# Patient Record
Sex: Female | Born: 1960
Health system: Southern US, Community
[De-identification: ages and names within clinical notes are randomized; demographics above are authoritative.]

## PROBLEM LIST (undated history)

## (undated) DIAGNOSIS — I10 Essential (primary) hypertension: Secondary | ICD-10-CM

## (undated) HISTORY — PX: CARPAL TUNNEL RELEASE: SHX101

## (undated) HISTORY — PX: TONSILLECTOMY: SUR1361

## (undated) HISTORY — PX: ABDOMINAL HYSTERECTOMY: SHX81

---

## 2013-07-26 ENCOUNTER — Emergency Department (HOSPITAL_BASED_OUTPATIENT_CLINIC_OR_DEPARTMENT_OTHER)
Admission: EM | Admit: 2013-07-26 | Discharge: 2013-07-26 | Disposition: A | Discharge status: Home / Self Care | Payer: Worker's Compensation | Attending: Emergency Medicine | Admitting: Emergency Medicine

## 2013-07-26 ENCOUNTER — Encounter (HOSPITAL_BASED_OUTPATIENT_CLINIC_OR_DEPARTMENT_OTHER): Payer: Self-pay | Admitting: Emergency Medicine

## 2013-07-26 ENCOUNTER — Emergency Department (HOSPITAL_BASED_OUTPATIENT_CLINIC_OR_DEPARTMENT_OTHER): Payer: Worker's Compensation

## 2013-07-26 DIAGNOSIS — S20229A Contusion of unspecified back wall of thorax, initial encounter: Secondary | ICD-10-CM | POA: Insufficient documentation

## 2013-07-26 DIAGNOSIS — I10 Essential (primary) hypertension: Secondary | ICD-10-CM | POA: Insufficient documentation

## 2013-07-26 DIAGNOSIS — Y92410 Unspecified street and highway as the place of occurrence of the external cause: Secondary | ICD-10-CM | POA: Insufficient documentation

## 2013-07-26 DIAGNOSIS — Y9389 Activity, other specified: Secondary | ICD-10-CM | POA: Insufficient documentation

## 2013-07-26 DIAGNOSIS — Z79899 Other long term (current) drug therapy: Secondary | ICD-10-CM | POA: Insufficient documentation

## 2013-07-26 DIAGNOSIS — T148XXA Other injury of unspecified body region, initial encounter: Secondary | ICD-10-CM | POA: Diagnosis present

## 2013-07-26 HISTORY — DX: Essential (primary) hypertension: I10
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
## 2015-07-02 IMAGING — CR DG LUMBAR SPINE 2-3V
3 series · 3 of 3 positions shown · non-contrast
Comparison: None.

CLINICAL DATA: Fall.

EXAM:
LUMBAR SPINE - 2-3 VIEW

[t l-spine a.p.]
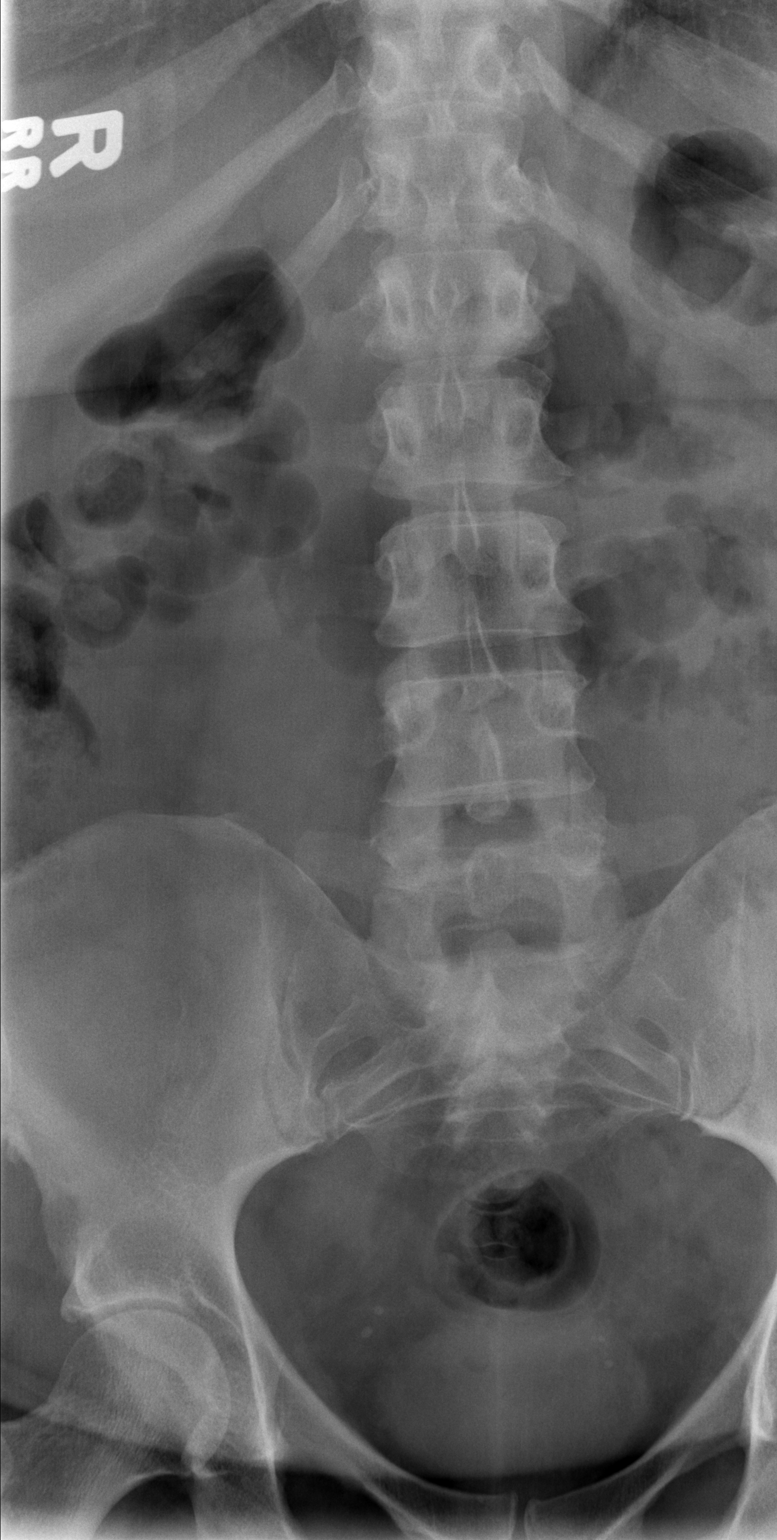

[t l-spine lat]
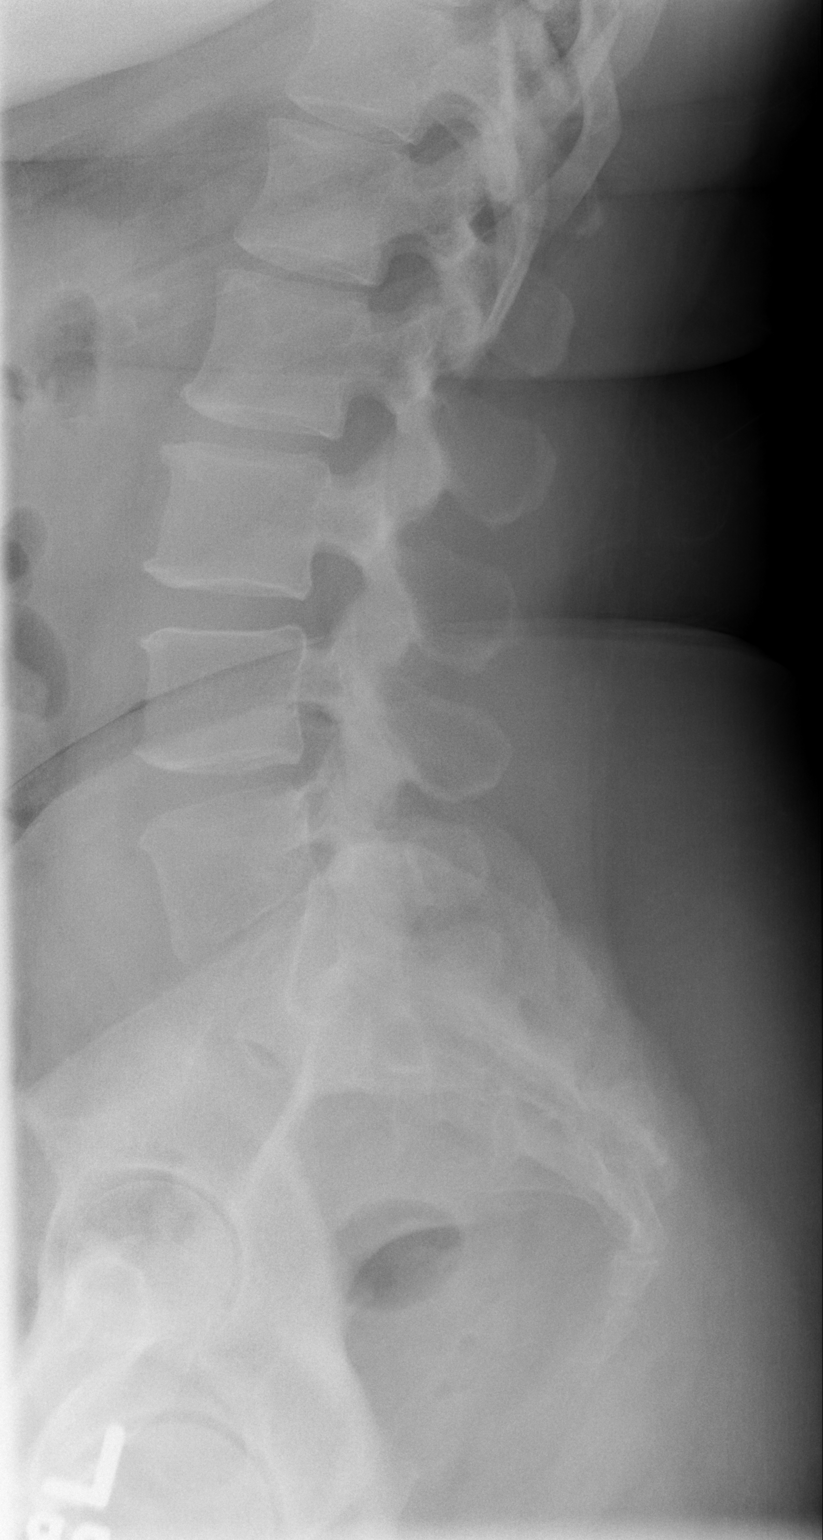

[t l-spine l5-s1 spot]
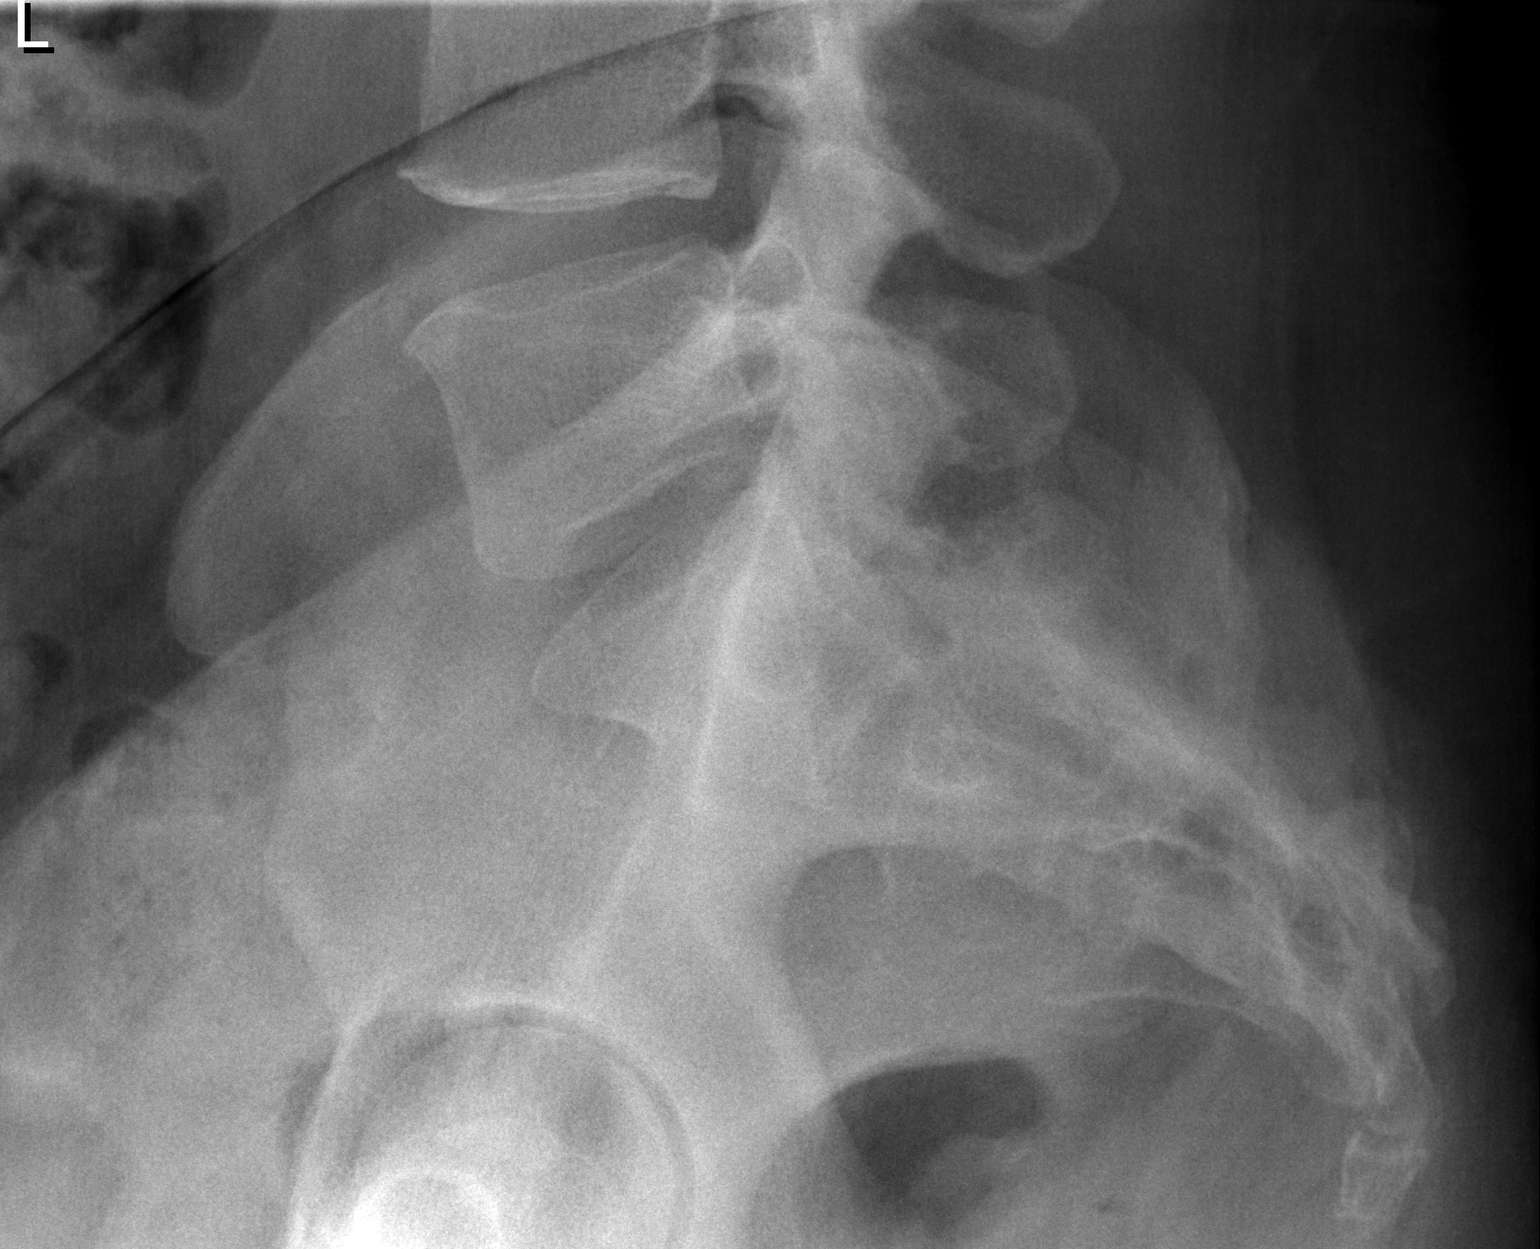

[3 of 3 positions shown; findings below may reference images not displayed]

FINDINGS: There is no evidence of lumbar spine fracture. Alignment is normal.
Intervertebral disc spaces are maintained.
IMPRESSION: Negative.

## 2015-07-02 IMAGING — CR DG THORACIC SPINE 2V
3 series · 3 of 3 positions shown · non-contrast
Comparison: None.

CLINICAL DATA: Fall, mid back pain and buttock pain.

EXAM:
THORACIC SPINE - 2 VIEW

[w t-spine a.p. *]
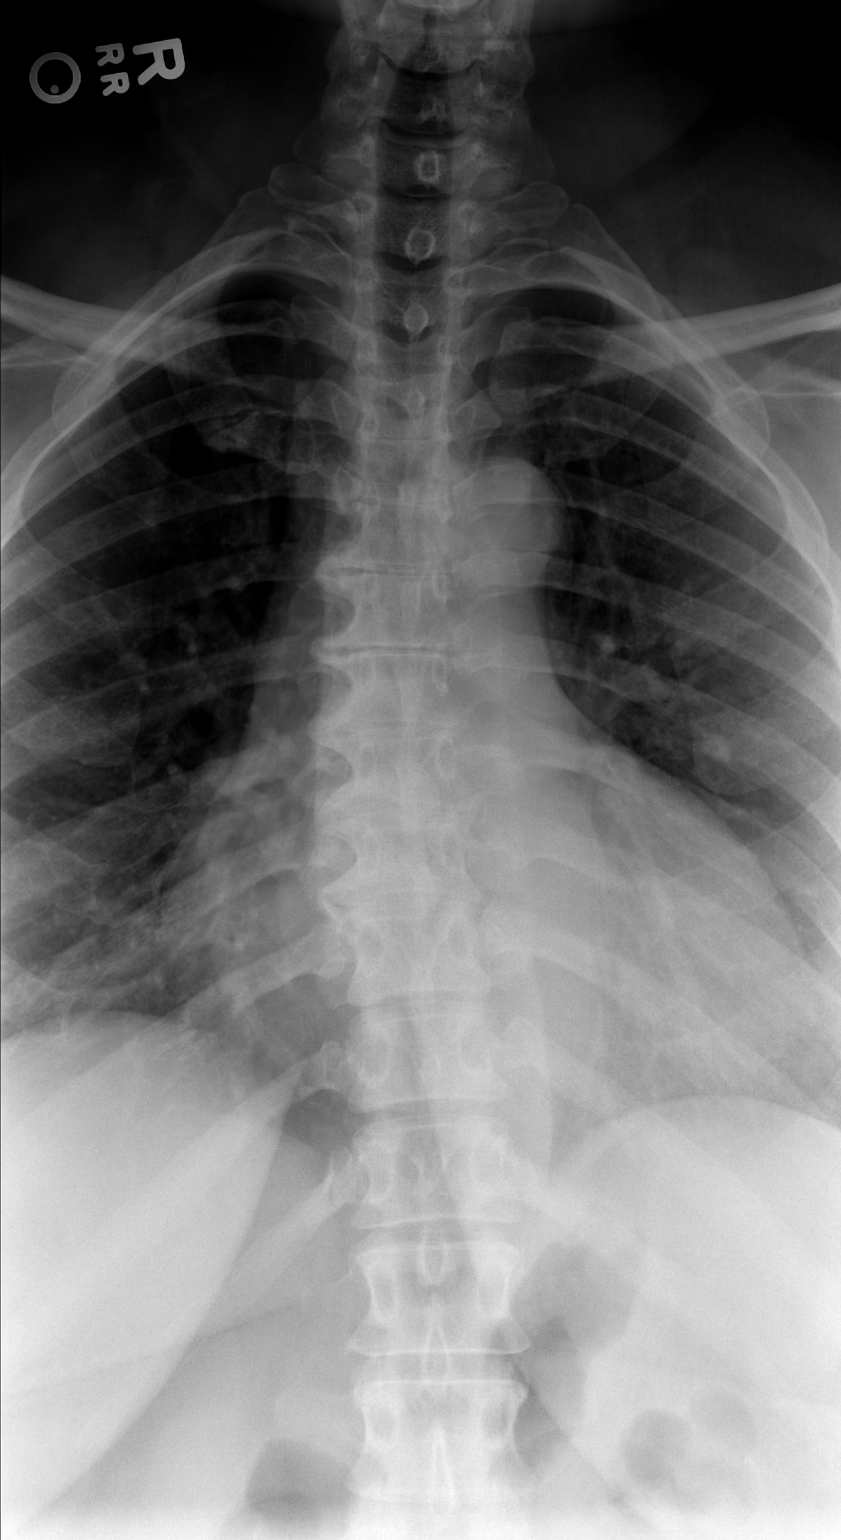

[w t-spine lat *]
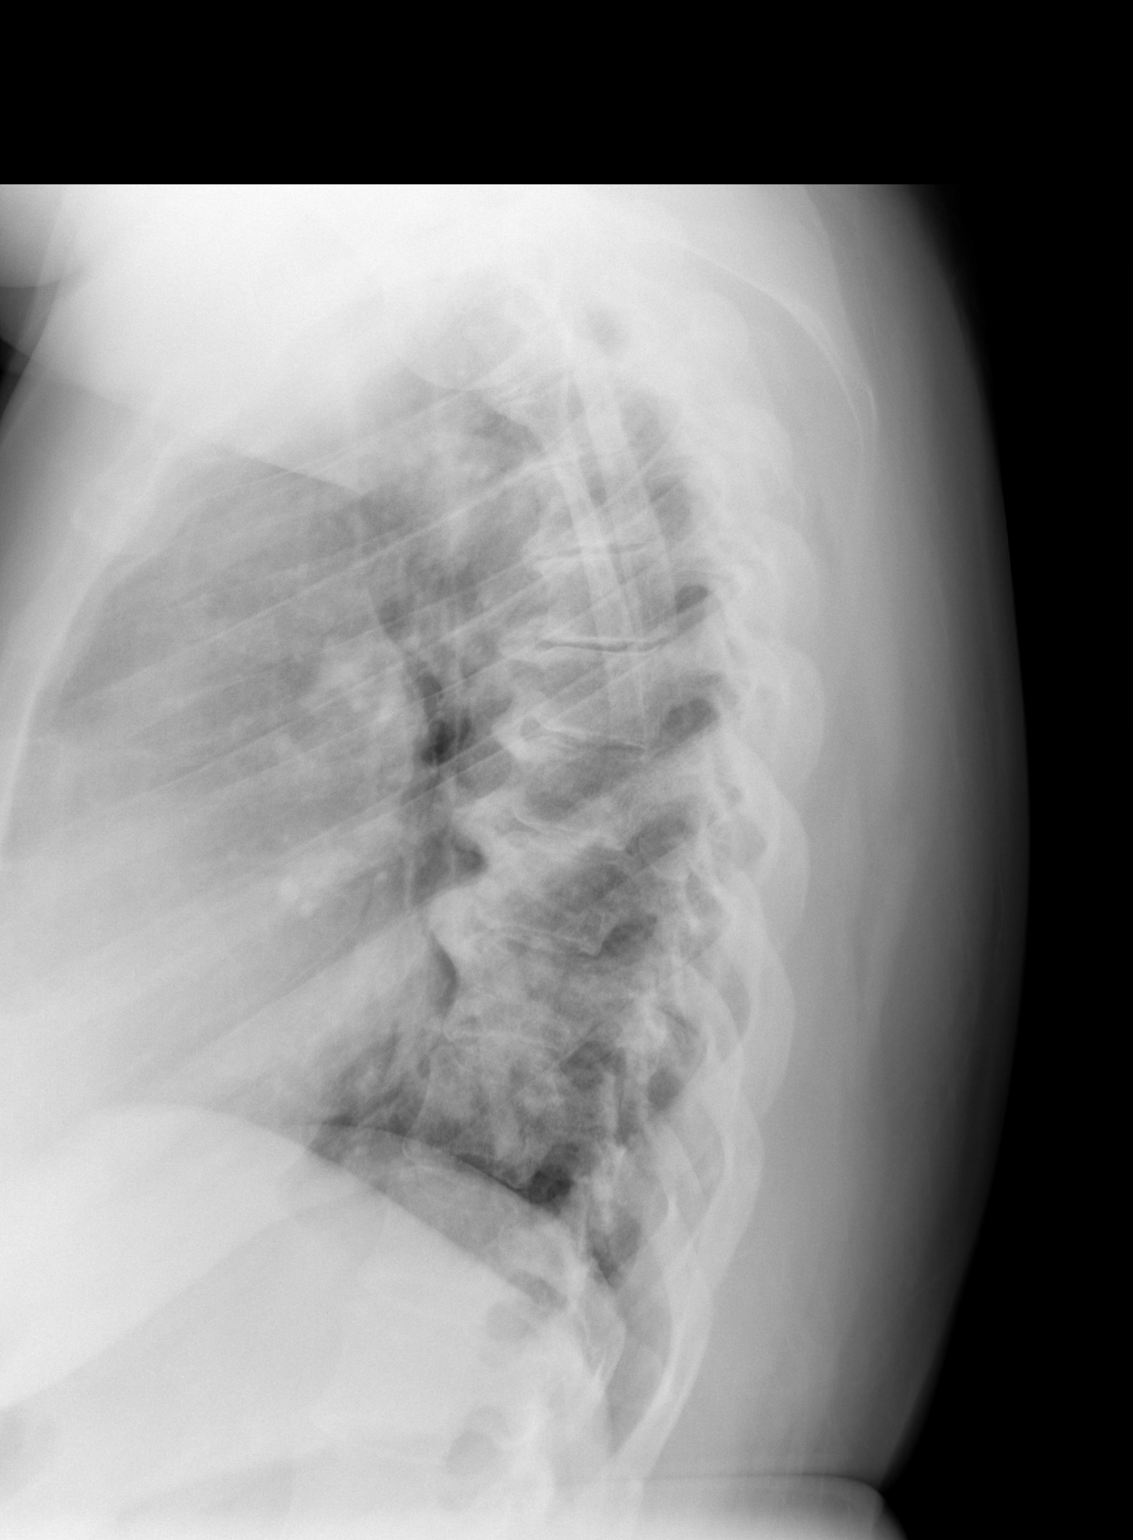

[w swimmers view *]
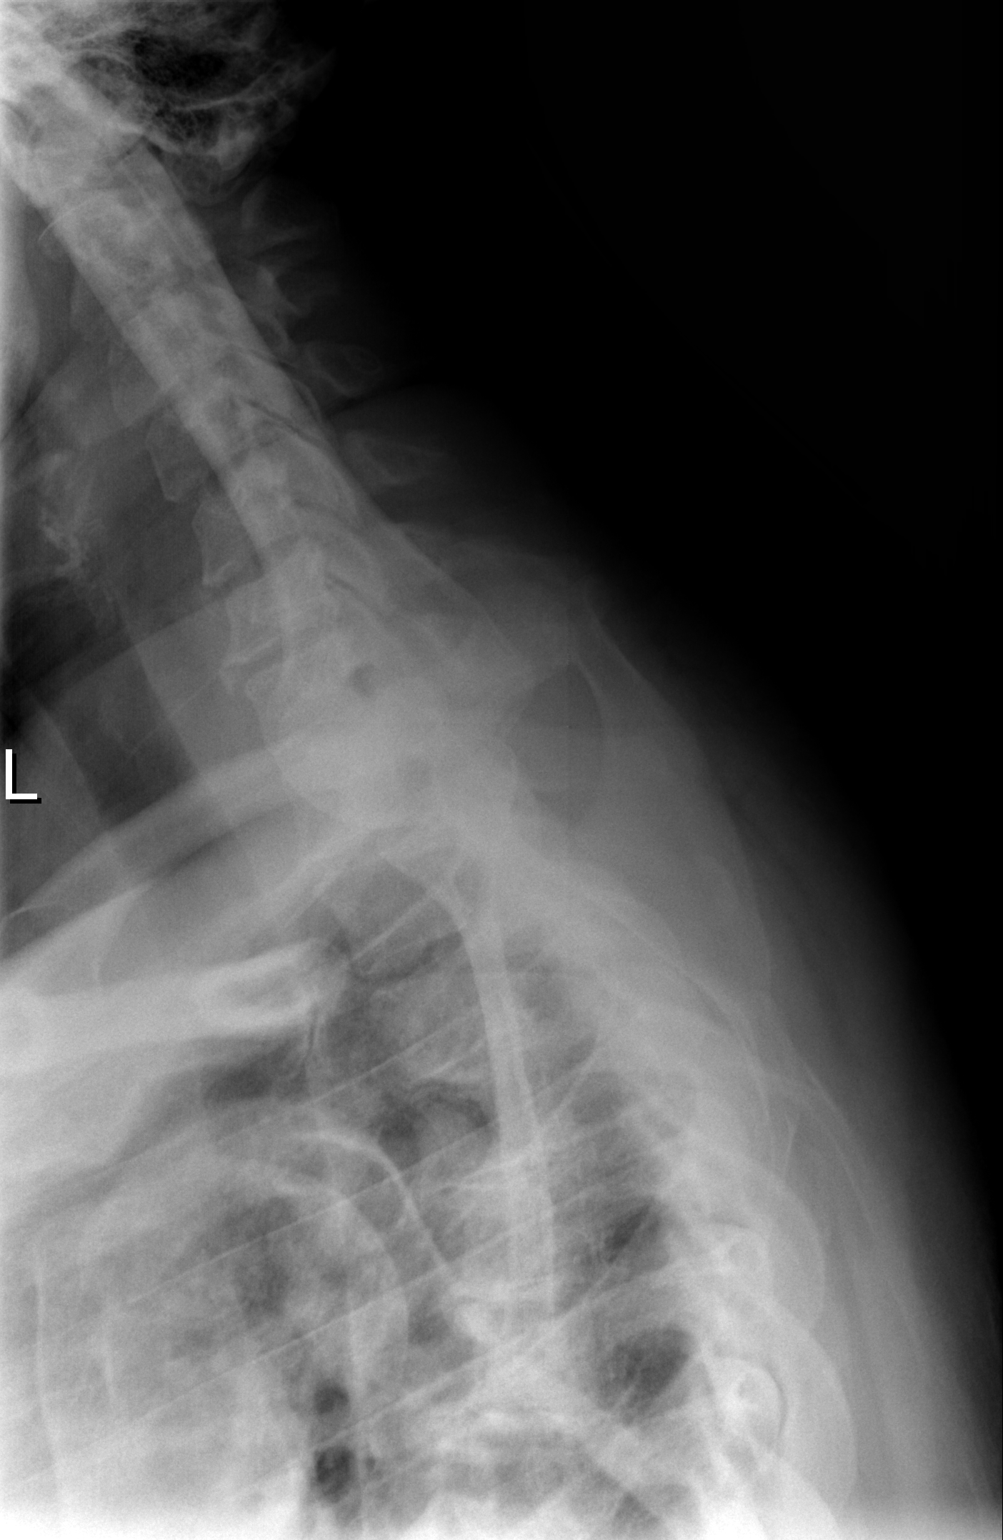

[3 of 3 positions shown; findings below may reference images not displayed]

FINDINGS: The vertebral body heights are maintained. The alignment is
anatomic. There is no acute fracture or static listhesis. There is
mild degenerative disc disease throughout the thoracic spine.

The visualized portions of the lungs are clear.
IMPRESSION: No acute osseous injury of the thoracic spine.

## 2017-09-16 ENCOUNTER — Emergency Department (HOSPITAL_BASED_OUTPATIENT_CLINIC_OR_DEPARTMENT_OTHER)
Admission: EM | Admit: 2017-09-16 | Discharge: 2017-09-16 | Disposition: A | Discharge status: Home / Self Care | Payer: Worker's Compensation | Attending: Emergency Medicine | Admitting: Emergency Medicine

## 2017-09-16 ENCOUNTER — Other Ambulatory Visit: Payer: Self-pay

## 2017-09-16 ENCOUNTER — Encounter (HOSPITAL_BASED_OUTPATIENT_CLINIC_OR_DEPARTMENT_OTHER): Payer: Self-pay | Admitting: Emergency Medicine

## 2017-09-16 DIAGNOSIS — R499 Unspecified voice and resonance disorder: Secondary | ICD-10-CM | POA: Diagnosis present

## 2017-09-16 DIAGNOSIS — H938X3 Other specified disorders of ear, bilateral: Secondary | ICD-10-CM

## 2017-09-16 DIAGNOSIS — I10 Essential (primary) hypertension: Secondary | ICD-10-CM | POA: Diagnosis not present

## 2017-09-16 DIAGNOSIS — H9203 Otalgia, bilateral: Secondary | ICD-10-CM | POA: Insufficient documentation

## 2017-09-16 DIAGNOSIS — Z79899 Other long term (current) drug therapy: Secondary | ICD-10-CM | POA: Diagnosis not present

## 2017-09-16 DIAGNOSIS — J392 Other diseases of pharynx: Secondary | ICD-10-CM

## 2017-09-16 MED ORDER — CETIRIZINE HCL 10 MG PO TABS: 10.0000 mg | ORAL_TABLET | Freq: Every day | ORAL | 0 refills | Status: AC

## 2017-09-16 MED ORDER — SALINE SPRAY 0.65 % NA SOLN: 1.0000 | NASAL | 0 refills | Status: AC | PRN

## 2017-09-16 MED FILL — CETIRIZINE HCL 10 MG TABS: 10 | 100 days supply | Qty: 100 | Fill #0

## 2017-09-16 MED FILL — DEEP SEA 0.65% NOSE SPRAY: 0.65 | 30 days supply | Qty: 44 | Fill #0
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
## 2017-09-16 NOTE — Discharge Instructions (Signed)
As discussed, try to use tea with honey, cough drops, throat sprays over-the-counter to help soothe your throat.  Nasal spray and antihistamines (cetirizine or benadryl) as needed.  Follow-up with your primary care provider for possible allergy skin testing. Return if symptoms worsen, facial swelling, difficulty breathing or swallowing or other new concerning symptoms in the meantime.
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
